# Patient Record
Sex: Female | Born: 1995 | Race: Black or African American | Hispanic: No | Marital: Single | Smoking: Never smoker
Health system: Southern US, Community
[De-identification: ages and names within clinical notes are randomized; demographics above are authoritative.]

## PROBLEM LIST (undated history)

## (undated) DIAGNOSIS — F32A Depression, unspecified: Secondary | ICD-10-CM

---
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
## 2019-05-17 ENCOUNTER — Ambulatory Visit: Payer: BC Managed Care – PPO | Attending: Internal Medicine

## 2019-05-17 DIAGNOSIS — Z20822 Contact with and (suspected) exposure to covid-19: Secondary | ICD-10-CM

## 2019-05-19 LAB — NOVEL CORONAVIRUS, NAA: SARS-CoV-2, NAA: NOT DETECTED

## 2019-07-14 ENCOUNTER — Ambulatory Visit: Payer: BC Managed Care – PPO | Attending: Internal Medicine

## 2019-10-16 ENCOUNTER — Ambulatory Visit
Admission: EM | Admit: 2019-10-16 | Discharge: 2019-10-16 | Disposition: A | Discharge status: Home / Self Care | Payer: BC Managed Care – PPO | Attending: Physician Assistant | Admitting: Physician Assistant

## 2019-10-16 ENCOUNTER — Ambulatory Visit: Payer: Self-pay

## 2019-10-16 ENCOUNTER — Other Ambulatory Visit: Payer: Self-pay

## 2019-10-16 DIAGNOSIS — J039 Acute tonsillitis, unspecified: Secondary | ICD-10-CM | POA: Diagnosis not present

## 2019-10-16 HISTORY — DX: Depression, unspecified: F32.A

## 2019-10-16 LAB — POCT RAPID STREP A (OFFICE): Rapid Strep A Screen: NEGATIVE

## 2019-10-16 MED ORDER — AMOXICILLIN 500 MG PO TABS: 500.0000 mg | ORAL_TABLET | Freq: Two times a day (BID) | ORAL | 0 refills | Status: AC

## 2019-10-16 NOTE — Discharge Instructions (Signed)
Rapid strep negative. However, given your exam, will cover you empirically for bacterial infection with amoxicillin. As discussed, symptoms can still be due to viral illness/ drainage down your throat. This usually takes 7-10 days to resolve. You can take over the counter allergy medicine such as Flonase, Zyrtec-D for nasal congestion/drainage. You can use over the counter nasal saline rinse such as neti pot for nasal congestion. Monitor for any worsening of symptoms, swelling of the throat, trouble breathing, trouble swallowing, leaning forward to breath, drooling, go to the emergency department for further evaluation needed.

## 2019-10-16 NOTE — ED Provider Notes (Signed)
[facility]    CSN: [id number] Arrival date & time: 10/16/19  1448      History   Chief Complaint Chief Complaint  Patient presents with  . Sore Throat    HPI [name] is a 24 y.o. female.   24 year old female comes in for 1 week history of sore throat, intermittent headaches. Denies rhinorrhea, nasal congestion, cough. Has had painful swallowing with throat swelling. Denies tripoding, drooling, trismus. Family noted that she has been snoring since symptom started. Denies fever, chills, body aches. Denies abdominal pain, nausea, vomiting, diarrhea. Denies shortness of breath, loss of taste/smell. Gets tested for COVID in school weekly without positive results.      Past Medical History:  Diagnosis Date  . Depression     Patient Active Problem List   Diagnosis Date Noted  . Goiter 05/22/2015  . Essential hypertension, benign 05/22/2015  . Acanthosis nigricans, acquired 05/22/2015  . Dyspepsia 05/22/2015  . Pure hypercholesterolemia 05/22/2015    History reviewed. No pertinent surgical history.  OB History   No obstetric history on file.      Home Medications    Prior to Admission medications   Medication Sig Start Date End Date Taking? Authorizing Provider  Norethindrone Acetate-Ethinyl Estrad-FE (BLISOVI 24 FE) 1-20 MG-MCG(24) tablet Take 1 tablet by mouth daily.   Yes [provider]  venlafaxine XR (EFFEXOR-XR) 37.5 MG 24 hr capsule Take 37.5 mg by mouth daily with breakfast.   Yes [provider]  amoxicillin (AMOXIL) 500 MG tablet Take 1 tablet (500 mg total) by mouth 2 (two) times daily. 10/16/19   Tasia Catchings, Bernyce Brimley V, PA-C  ranitidine (ZANTAC) 150 MG tablet Take 1 tablet (150 mg total) by mouth 2 (two) times daily. 08/01/15 10/16/19  Sherrlyn Hock, MD    Family History Family History  Problem Relation Age of Onset  . Obesity Mother   . Thyroid disease Maternal Grandmother     Social History Social History   Tobacco  Use  . Smoking status: Never Smoker  . Smokeless tobacco: Never Used  Vaping Use  . Vaping Use: Never used  Substance Use Topics  . Alcohol use: Yes    Alcohol/week: 0.0 standard drinks    Comment: socially  . Drug use: Never     Allergies   Sertraline   Review of Systems Review of Systems  Reason unable to perform ROS: See HPI as above.     Physical Exam Triage Vital Signs ED Triage Vitals  Enc Vitals Group     BP 10/16/19 1504 (!) 161/86     Pulse Rate 10/16/19 1504 81     Resp 10/16/19 1504 14     Temp 10/16/19 1504 98.9 F (37.2 C)     Temp Source 10/16/19 1504 Oral     SpO2 --      Weight --      Height --      Head Circumference --      Peak Flow --      Pain Score 10/16/19 1506 5     Pain Loc --      Pain Edu? --      Excl. in [place]? --    No data found.  Updated Vital Signs BP (!) 161/86 (BP Location: Left Arm)   Pulse 81   Temp 98.9 F (37.2 C) (Oral)   Resp 14   Physical Exam Constitutional:      General: She is not in acute  distress.    Appearance: Normal appearance. She is well-developed. She is not ill-appearing, toxic-appearing or diaphoretic.  HENT:     Head: Normocephalic and atraumatic.     Right Ear: Tympanic membrane, ear canal and external ear normal. Tympanic membrane is not erythematous or bulging.     Left Ear: Tympanic membrane, ear canal and external ear normal. Tympanic membrane is not erythematous or bulging.     Nose:     Right Sinus: No maxillary sinus tenderness or frontal sinus tenderness.     Left Sinus: No maxillary sinus tenderness or frontal sinus tenderness.     Mouth/Throat:     Mouth: Mucous membranes are moist.     Pharynx: Oropharynx is clear. Uvula midline.     Tonsils: Tonsillar exudate present. 2+ on the right. 2+ on the left.  Eyes:     Conjunctiva/sclera: Conjunctivae normal.     Pupils: Pupils are equal, round, and reactive to light.  Cardiovascular:     Rate and Rhythm: Normal rate and regular rhythm.    Pulmonary:     Effort: Pulmonary effort is normal. No accessory muscle usage, prolonged expiration, respiratory distress or retractions.     Breath sounds: No decreased air movement or transmitted upper airway sounds. No decreased breath sounds.     Comments: LCTAB Musculoskeletal:     Cervical back: Normal range of motion and neck supple.  Skin:    General: Skin is warm and dry.  Neurological:     Mental Status: She is alert and oriented to person, place, and time.      UC Treatments / Results  Labs (all labs ordered are listed, but only abnormal results are displayed) Labs Reviewed  POCT RAPID STREP A (OFFICE) - Normal  CULTURE, GROUP A STREP [facility])    EKG   Radiology No results found.  Procedures Procedures (including critical care time)  Medications Ordered in UC Medications - No data to display  Initial Impression / Assessment and Plan / UC Course  I have reviewed the triage vital signs and the nursing notes.  Pertinent labs & imaging results that were available during my care of the patient were reviewed by me and considered in my medical decision making (see chart for details).    Rapid strep negative.  However, given history and exam, will cover for tonsillitis with amoxicillin.  Other symptomatic treatment discussed.  Return precautions given.  Patient expresses understanding and agrees to plan.  Final Clinical Impressions(s) / UC Diagnoses   Final diagnoses:  Acute tonsillitis, unspecified etiology    ED Prescriptions    Medication Sig Dispense Auth. Provider   amoxicillin (AMOXIL) 500 MG tablet Take 1 tablet (500 mg total) by mouth 2 (two) times daily. 20 tablet Belinda Fisher, PA-C     PDMP not reviewed this encounter.   Belinda Fisher, PA-C 10/16/19 1529

## 2019-10-16 NOTE — ED Triage Notes (Signed)
Pt presents with c/o sore throat x one week and a headache off/on.  She denies any other symptoms.

## 2019-10-19 LAB — CULTURE, GROUP A STREP (THRC)

## 2020-03-27 ENCOUNTER — Ambulatory Visit: Payer: BC Managed Care – PPO

## 2020-03-27 ENCOUNTER — Ambulatory Visit: Payer: BC Managed Care – PPO | Attending: Internal Medicine

## 2020-03-27 DIAGNOSIS — Z23 Encounter for immunization: Secondary | ICD-10-CM

## 2020-03-27 NOTE — Progress Notes (Signed)
° °  Covid-19 Vaccination Clinic  Name:  Lisamarie A [place]    MRN: [id number] DOB: 02/20/1996  03/27/2020  Ms. [place] was observed post Covid-19 immunization for 30 minutes based on pre-vaccination screening without incident. She was provided with Vaccine Information Sheet and instruction to access the V-Safe system.   Ms. [place] was instructed to call 911 with any severe reactions post vaccine:  Difficulty breathing   Swelling of face and throat   A fast heartbeat   A bad rash all over body   Dizziness and weakness   Immunizations Administered    Name Date Dose VIS Date Route   Pfizer COVID-19 Vaccine 03/27/2020  3:36 PM 0.3 mL 02/22/2020 Intramuscular   Manufacturer: [employer], [employer]   Lot: [id number]   NDC: [address]-2

## 2020-04-24 ENCOUNTER — Other Ambulatory Visit: Payer: BC Managed Care – PPO

## 2020-04-24 DIAGNOSIS — Z20822 Contact with and (suspected) exposure to covid-19: Secondary | ICD-10-CM

## 2020-04-26 LAB — NOVEL CORONAVIRUS, NAA: SARS-CoV-2, NAA: NOT DETECTED

## 2020-04-26 LAB — SARS-COV-2, NAA 2 DAY TAT

## 2020-05-10 ENCOUNTER — Other Ambulatory Visit: Payer: Self-pay

## 2020-05-10 DIAGNOSIS — Z20822 Contact with and (suspected) exposure to covid-19: Secondary | ICD-10-CM

## 2020-05-16 LAB — NOVEL CORONAVIRUS, NAA: SARS-CoV-2, NAA: NOT DETECTED

## 2021-08-07 ENCOUNTER — Other Ambulatory Visit: Payer: Self-pay | Admitting: Family Medicine

## 2021-08-07 DIAGNOSIS — E041 Nontoxic single thyroid nodule: Secondary | ICD-10-CM

## 2021-10-18 ENCOUNTER — Ambulatory Visit
Admission: RE | Admit: 2021-10-18 | Discharge: 2021-10-18 | Disposition: A | Discharge status: Home / Self Care | Payer: BC Managed Care – PPO | Source: Ambulatory Visit | Attending: Family Medicine | Admitting: Family Medicine

## 2021-10-18 DIAGNOSIS — E041 Nontoxic single thyroid nodule: Secondary | ICD-10-CM

## 2023-08-06 ENCOUNTER — Ambulatory Visit: Admission: EM | Admit: 2023-08-06 | Discharge: 2023-08-06 | Disposition: A | Discharge status: Home / Self Care

## 2023-08-06 ENCOUNTER — Encounter: Payer: Self-pay | Admitting: *Deleted

## 2023-08-06 DIAGNOSIS — S00412A Abrasion of left ear, initial encounter: Secondary | ICD-10-CM | POA: Diagnosis not present

## 2023-08-06 NOTE — Discharge Instructions (Addendum)
 You have a small superficial abrasion to left ear. May use over the counter antibiotic ointment to abrasion. Tylenol as label directed for any pain. Follow up with PCP, return as needed
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
